# Patient Record
Sex: Male | Born: 1990 | Race: Black or African American | Hispanic: No | Marital: Single | Smoking: Never smoker
Health system: Southern US, Community
[De-identification: ages and names within clinical notes are randomized; demographics above are authoritative.]

## PROBLEM LIST (undated history)

## (undated) ENCOUNTER — Encounter

## (undated) DIAGNOSIS — J36 Peritonsillar abscess: Secondary | ICD-10-CM

## (undated) HISTORY — PX: ABSCESS DRAINAGE: SHX1119

---
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
## 2012-06-12 ENCOUNTER — Observation Stay (INDEPENDENT_AMBULATORY_CARE_PROVIDER_SITE_OTHER)
Admission: EM | Admit: 2012-06-12 | Discharge: 2012-06-12 | Disposition: A | Discharge status: Nursing Home (Includes ICF) | Payer: Medicaid Other | Source: Home / Self Care | Admitting: Otolaryngology

## 2012-06-12 ENCOUNTER — Encounter (HOSPITAL_COMMUNITY): Payer: Self-pay | Admitting: Emergency Medicine

## 2012-06-12 ENCOUNTER — Encounter (HOSPITAL_COMMUNITY)
Admission: EM | Disposition: A | Discharge status: Home / Self Care | Payer: Self-pay | Source: Home / Self Care | Attending: Emergency Medicine

## 2012-06-12 ENCOUNTER — Emergency Department (HOSPITAL_COMMUNITY): Payer: Medicaid Other | Admitting: Anesthesiology

## 2012-06-12 ENCOUNTER — Observation Stay (HOSPITAL_COMMUNITY)
Admission: EM | Admit: 2012-06-12 | Discharge: 2012-06-13 | Disposition: A | Discharge status: Home / Self Care | Payer: Medicaid Other | Attending: Otolaryngology | Admitting: Otolaryngology

## 2012-06-12 ENCOUNTER — Encounter (HOSPITAL_COMMUNITY): Payer: Self-pay | Admitting: Anesthesiology

## 2012-06-12 DIAGNOSIS — J36 Peritonsillar abscess: Principal | ICD-10-CM

## 2012-06-12 HISTORY — PX: TONSILLECTOMY: SHX5217

## 2012-06-12 HISTORY — DX: Peritonsillar abscess: J36

## 2012-06-12 SURGERY — TONSILLECTOMY
Anesthesia: General | Site: Throat | Laterality: Bilateral | Wound class: Dirty or Infected

## 2012-06-12 MED ORDER — OXYCODONE HCL 5 MG PO TABS: 5.0000 mg | ORAL_TABLET | Freq: Once | ORAL | Status: DC | PRN

## 2012-06-12 MED ORDER — OXYCODONE HCL 5 MG/5ML PO SOLN: 5.0000 mg | Freq: Once | ORAL | Status: DC | PRN

## 2012-06-12 MED ORDER — ONDANSETRON HCL 4 MG/2ML IJ SOLN: 4.0000 mg | Freq: Once | INTRAMUSCULAR | Status: DC | PRN

## 2012-06-12 MED ORDER — SODIUM CHLORIDE 0.9 % IV SOLN: 3.0000 g | Freq: Once | INTRAVENOUS | Status: DC

## 2012-06-12 MED ORDER — ARTIFICIAL TEARS OP OINT
TOPICAL_OINTMENT | OPHTHALMIC | Status: DC | PRN
  Administered 2012-06-12: 1 via OPHTHALMIC

## 2012-06-12 MED ORDER — HYDROCODONE-ACETAMINOPHEN 7.5-325 MG/15ML PO SOLN: 10.0000 mL | ORAL | Status: AC | PRN

## 2012-06-12 MED ORDER — PHENOL 1.4 % MT LIQD: 1.0000 | OROMUCOSAL | Status: AC | PRN

## 2012-06-12 MED ORDER — PROMETHAZINE HCL 25 MG RE SUPP: 25.0000 mg | Freq: Four times a day (QID) | RECTAL | Status: AC | PRN

## 2012-06-12 MED ORDER — MEPERIDINE HCL 25 MG/ML IJ SOLN: 6.2500 mg | INTRAMUSCULAR | Status: DC | PRN

## 2012-06-12 MED ORDER — ONDANSETRON HCL 4 MG/2ML IJ SOLN: 4.0000 mg | Freq: Once | INTRAMUSCULAR | Status: DC

## 2012-06-12 MED ORDER — PROMETHAZINE HCL 25 MG RE SUPP: 25.0000 mg | Freq: Four times a day (QID) | RECTAL | Status: DC | PRN

## 2012-06-12 MED ORDER — DEXTROSE-NACL 5-0.9 % IV SOLN
INTRAVENOUS | Status: DC
  Administered 2012-06-12: via INTRAVENOUS

## 2012-06-12 MED ORDER — 0.9 % SODIUM CHLORIDE (POUR BTL) OPTIME
TOPICAL | Status: DC | PRN
  Administered 2012-06-12: 1000 mL

## 2012-06-12 MED ORDER — PHENOL 1.4 % MT LIQD: 1.0000 | OROMUCOSAL | Status: DC | PRN

## 2012-06-12 MED ORDER — HYDROMORPHONE HCL PF 1 MG/ML IJ SOLN: 0.2500 mg | INTRAMUSCULAR | Status: DC | PRN

## 2012-06-12 MED ORDER — METOCLOPRAMIDE HCL 5 MG/ML IJ SOLN
INTRAMUSCULAR | Status: DC | PRN
  Administered 2012-06-12: 10 mg via INTRAVENOUS

## 2012-06-12 MED ORDER — IBUPROFEN 100 MG/5ML PO SUSP
400.0000 mg | Freq: Four times a day (QID) | ORAL | Status: DC | PRN
  Filled 2012-06-12: qty 20

## 2012-06-12 MED ORDER — LACTATED RINGERS IV SOLN
INTRAVENOUS | Status: DC | PRN
  Administered 2012-06-12 (×2): via INTRAVENOUS

## 2012-06-12 MED ORDER — IBUPROFEN 100 MG/5ML PO SUSP: 400.0000 mg | Freq: Four times a day (QID) | ORAL | Status: DC | PRN

## 2012-06-12 MED ORDER — GLYCOPYRROLATE 0.2 MG/ML IJ SOLN
INTRAMUSCULAR | Status: DC | PRN
  Administered 2012-06-12: 0.3 mg via INTRAVENOUS

## 2012-06-12 MED ORDER — LIDOCAINE HCL (CARDIAC) 20 MG/ML IV SOLN
INTRAVENOUS | Status: DC | PRN
  Administered 2012-06-12: 100 mg via INTRAVENOUS

## 2012-06-12 MED ORDER — SODIUM CHLORIDE 0.9 % IV BOLUS (SEPSIS)
1000.0000 mL | Freq: Once | INTRAVENOUS | Status: AC
  Administered 2012-06-13: 1000 mL via INTRAVENOUS

## 2012-06-12 MED ORDER — HYDROCODONE-ACETAMINOPHEN 7.5-325 MG/15ML PO SOLN: 15.0000 mL | ORAL | Status: AC | PRN

## 2012-06-12 MED ORDER — MIDAZOLAM HCL 5 MG/5ML IJ SOLN
INTRAMUSCULAR | Status: DC | PRN
  Administered 2012-06-12: 2 mg via INTRAVENOUS

## 2012-06-12 MED ORDER — PROMETHAZINE HCL 25 MG PO TABS: 25.0000 mg | ORAL_TABLET | Freq: Four times a day (QID) | ORAL | Status: AC | PRN

## 2012-06-12 MED ORDER — CLINDAMYCIN PHOSPHATE 600 MG/50ML IV SOLN
INTRAVENOUS | Status: DC | PRN
  Administered 2012-06-12: 600 mg via INTRAVENOUS

## 2012-06-12 MED ORDER — HYDROCODONE-ACETAMINOPHEN 7.5-325 MG/15ML PO SOLN
10.0000 mL | ORAL | Status: DC | PRN
  Administered 2012-06-12 – 2012-06-13 (×2): 15 mL via ORAL
  Filled 2012-06-12 (×2): qty 15

## 2012-06-12 MED ORDER — CLINDAMYCIN HCL 300 MG PO CAPS: 300.0000 mg | ORAL_CAPSULE | Freq: Three times a day (TID) | ORAL | Status: AC

## 2012-06-12 MED ORDER — MORPHINE SULFATE 4 MG/ML IJ SOLN: 4.0000 mg | Freq: Once | INTRAMUSCULAR | Status: DC

## 2012-06-12 MED ORDER — IBUPROFEN 100 MG/5ML PO SUSP: 400.0000 mg | Freq: Four times a day (QID) | ORAL | Status: AC | PRN

## 2012-06-12 MED ORDER — PROPOFOL 10 MG/ML IV BOLUS
INTRAVENOUS | Status: DC | PRN
  Administered 2012-06-12: 80 mg via INTRAVENOUS
  Administered 2012-06-12: 120 mg via INTRAVENOUS

## 2012-06-12 MED ORDER — FENTANYL CITRATE 0.05 MG/ML IJ SOLN
INTRAMUSCULAR | Status: DC | PRN
  Administered 2012-06-12 (×3): 50 ug via INTRAVENOUS
  Administered 2012-06-12: 150 ug via INTRAVENOUS

## 2012-06-12 MED ORDER — DEXTROSE 5 % IV SOLN
INTRAVENOUS | Status: DC | PRN
  Administered 2012-06-12: 18:00:00 via INTRAVENOUS

## 2012-06-12 MED ORDER — DEXTROSE-NACL 5-0.9 % IV SOLN: INTRAVENOUS | Status: AC

## 2012-06-12 MED ORDER — ONDANSETRON HCL 4 MG/2ML IJ SOLN
INTRAMUSCULAR | Status: DC | PRN
  Administered 2012-06-12: 4 mg via INTRAVENOUS

## 2012-06-12 MED ORDER — CLINDAMYCIN HCL 300 MG PO CAPS
300.0000 mg | ORAL_CAPSULE | Freq: Three times a day (TID) | ORAL | Status: DC
  Administered 2012-06-12 – 2012-06-13 (×3): 300 mg via ORAL
  Filled 2012-06-12 (×5): qty 1

## 2012-06-12 MED ORDER — PROMETHAZINE HCL 25 MG PO TABS: 25.0000 mg | ORAL_TABLET | Freq: Four times a day (QID) | ORAL | Status: DC | PRN

## 2012-06-12 MED ORDER — DEXAMETHASONE SODIUM PHOSPHATE 4 MG/ML IJ SOLN
INTRAMUSCULAR | Status: DC | PRN
  Administered 2012-06-12: 12 mg via INTRAVENOUS

## 2012-06-12 MED ORDER — SUCCINYLCHOLINE CHLORIDE 20 MG/ML IJ SOLN
INTRAMUSCULAR | Status: DC | PRN
  Administered 2012-06-12: 120 mg via INTRAVENOUS

## 2012-06-12 SURGICAL SUPPLY — 29 items
CANISTER SUCTION 2500CC (MISCELLANEOUS) ×2 IMPLANT
CATH ROBINSON RED A/P 10FR (CATHETERS) ×2 IMPLANT
CLEANER TIP ELECTROSURG 2X2 (MISCELLANEOUS) ×2 IMPLANT
CLOTH BEACON ORANGE TIMEOUT ST (SAFETY) ×2 IMPLANT
COAGULATOR SUCT SWTCH 10FR 6 (ELECTROSURGICAL) ×2 IMPLANT
ELECT COATED BLADE 2.86 ST (ELECTRODE) ×2 IMPLANT
ELECT REM PT RETURN 9FT ADLT (ELECTROSURGICAL)
ELECT REM PT RETURN 9FT PED (ELECTROSURGICAL)
ELECTRODE REM PT RETRN 9FT PED (ELECTROSURGICAL) IMPLANT
ELECTRODE REM PT RTRN 9FT ADLT (ELECTROSURGICAL) IMPLANT
GAUZE SPONGE 4X4 16PLY XRAY LF (GAUZE/BANDAGES/DRESSINGS) ×2 IMPLANT
GLOVE ECLIPSE 7.5 STRL STRAW (GLOVE) ×2 IMPLANT
GOWN STRL NON-REIN LRG LVL3 (GOWN DISPOSABLE) ×4 IMPLANT
KIT BASIN OR (CUSTOM PROCEDURE TRAY) ×2 IMPLANT
KIT ROOM TURNOVER OR (KITS) ×2 IMPLANT
NS IRRIG 1000ML POUR BTL (IV SOLUTION) ×2 IMPLANT
PACK SURGICAL SETUP 50X90 (CUSTOM PROCEDURE TRAY) ×2 IMPLANT
PAD ARMBOARD 7.5X6 YLW CONV (MISCELLANEOUS) ×4 IMPLANT
PENCIL FOOT CONTROL (ELECTRODE) ×2 IMPLANT
SPECIMEN JAR SMALL (MISCELLANEOUS) IMPLANT
SPONGE TONSIL 1 RF SGL (DISPOSABLE) ×2 IMPLANT
SYR BULB 3OZ (MISCELLANEOUS) ×2 IMPLANT
TOWEL OR 17X24 6PK STRL BLUE (TOWEL DISPOSABLE) ×4 IMPLANT
TUBE CONNECTING 12X1/4 (SUCTIONS) ×2 IMPLANT
TUBE SALEM SUMP 10F W/ARV (TUBING) IMPLANT
TUBE SALEM SUMP 12R W/ARV (TUBING) ×2 IMPLANT
TUBE SALEM SUMP 14F W/ARV (TUBING) IMPLANT
TUBE SALEM SUMP 16 FR W/ARV (TUBING) IMPLANT
WATER STERILE IRR 1000ML POUR (IV SOLUTION) IMPLANT

## 2012-06-12 NOTE — Anesthesia Procedure Notes (Signed)
Procedure Name: Intubation Date/Time: 06/12/2012 5:58 PM Performed by: Wray Kearns A Pre-anesthesia Checklist: Patient identified, Timeout performed, Emergency Drugs available, Suction available and Patient being monitored Patient Re-evaluated:Patient Re-evaluated prior to inductionOxygen Delivery Method: Circle system utilized Preoxygenation: Pre-oxygenation with 100% oxygen Intubation Type: IV induction, Rapid sequence and Cricoid Pressure applied Laryngoscope Size: Mac and 4 Grade View: Grade I Tube type: Oral Tube size: 8.0 mm Number of attempts: 1 Airway Equipment and Method: Stylet Placement Confirmation: ETT inserted through vocal cords under direct vision,  breath sounds checked- equal and bilateral and positive ETCO2 Secured at: 22 cm Tube secured with: Tape Dental Injury: Teeth and Oropharynx as per pre-operative assessment

## 2012-06-12 NOTE — Op Note (Signed)
06/12/2012  6:33 PM  PATIENT:  Jossie Ng  22 y.o. male  PRE-OPERATIVE DIAGNOSIS:  abscess  POST-OPERATIVE DIAGNOSIS:  Recurrent Peritonsillar Abscess  PROCEDURE:  Procedure(s): TONSILLECTOMY  SURGEON:  Surgeon(s): Serena Colonel, MD  ANESTHESIA:   General  COUNTS: Correct   DICTATION: The patient was taken to the operating room and placed on the operating table in the supine position. Following induction of general endotracheal anesthesia, the table was turned and the patient was draped in a standard fashion. A Crowe-Davis mouthgag was inserted into the oral cavity and used to retract the tongue and mandible, then attached to the Mayo stand.  The tonsillectomy was then performed using electrocautery dissection, carefully dissecting the relatively avascular plane between the capsule and constrictor muscles. There was a residual abscess cavity on the left side. The surrounding tissue was severely inflamed and thickened. Suction cautery was used for completion of hemostasis. The tonsils were discarded.  The pharynx was irrigated with saline and suctioned. An oral gastric tube was used to aspirate the contents of the stomach. The patient was then awakened from anesthesia and transferred to PACU in stable condition.   PATIENT DISPOSITION:  To PACA, stable

## 2012-06-12 NOTE — H&P (Signed)
Scott Williamson is an 22 y.o. male.   Chief Complaint: Sore throat HPI: He had a left peritonsillar abscess drained in [place] about one month ago. He started having bad sore throat on the left side the past several days, similar to what he had when he had the abscess. He is having a hard time swallowing.  Past Medical History  Diagnosis Date  . Tonsillar abscess     Past Surgical History  Procedure Laterality Date  . Abscess drainage      Tonsillar abscess    History reviewed. No pertinent family history. Social History:  reports that he has never smoked. He does not have any smokeless tobacco history on file. He reports that he does not drink alcohol or use illicit drugs.  Allergies: No Known Allergies   (Not in a hospital admission)  No results found for this or any previous visit (from the past 48 hour(s)). No results found.  ROS: otherwise negative  Blood pressure 135/66, pulse 74, temperature 99.7 F (37.6 C), temperature source Oral, resp. rate 18, SpO2 100.00%.  PHYSICAL EXAM: Overall appearance:  Healthy appearing, in no distress Head:  Normocephalic, atraumatic. Ears: External ears are normal. Nose: External nose is healthy in appearance. Internal nasal exam free of any lesions or obstruction. Oral Cavity/pharynx:  There are no mucosal lesions or masses identified. Asymmetric soft tissue swelling of the left side soft palate with erythema and tenderness. Neuro:  No identifiable neurologic deficits. Neck: Tender left level II adenopathy.  Studies Reviewed: none    Assessment/Plan Recurrent peritonsillar abscess. Recommend proceed with tonsillectomy. Risks and benefits of surgery were discussed in detail. All questions were answered.  Scott Williamson 06/12/2012, 5:19 PM

## 2012-06-12 NOTE — Anesthesia Preprocedure Evaluation (Addendum)
Anesthesia Evaluation  Patient identified by MRN, date of birth, ID band Patient awake    Reviewed: Allergy & Precautions, H&P , NPO status , Patient's Chart, lab work & pertinent test results  Airway Mallampati: II TM Distance: >3 FB Neck ROM: Full    Dental  (+) Teeth Intact and Dental Advisory Given   Pulmonary Current Smoker,          Cardiovascular     Neuro/Psych    GI/Hepatic (+)     substance abuse  marijuana use,   Endo/Other    Renal/GU      Musculoskeletal   Abdominal   Peds  Hematology   Anesthesia Other Findings   Reproductive/Obstetrics                           Anesthesia Physical Anesthesia Plan  ASA: II and emergent  Anesthesia Plan: General   Post-op Pain Management:    Induction: Intravenous, Rapid sequence and Cricoid pressure planned  Airway Management Planned: Oral ETT  Additional Equipment:   Intra-op Plan:   Post-operative Plan: Extubation in OR  Informed Consent: I have reviewed the patients History and Physical, chart, labs and discussed the procedure including the risks, benefits and alternatives for the proposed anesthesia with the patient or authorized representative who has indicated his/her understanding and acceptance.     Plan Discussed with: CRNA and Surgeon  Anesthesia Plan Comments:         Anesthesia Quick Evaluation

## 2012-06-12 NOTE — ED Notes (Signed)
Pt states that he has had a tonsillar abcess x 1 month. Pt was seen at Urgent Care this morning.

## 2012-06-12 NOTE — Preoperative (Signed)
Beta Blockers   Reason not to administer Beta Blockers:Not Applicable 

## 2012-06-12 NOTE — ED Notes (Signed)
Chart review.

## 2012-06-12 NOTE — ED Provider Notes (Signed)
Medical screening examination/treatment/procedure(s) were performed by non-physician practitioner and as supervising physician I was immediately available for consultation/collaboration.  Autumne Kallio, M.D.  Xxavier Noon C Ahniya Mitchum, MD 06/12/12 1923 

## 2012-06-12 NOTE — ED Provider Notes (Signed)
History     CSN: [id number]  Arrival date & time 06/12/12  1628   First MD Initiated Contact with Patient 06/12/12 1631      Chief Complaint  Patient presents with  . Abscess    Tonsilar abscess    (Consider location/radiation/quality/duration/timing/severity/associated sxs/prior treatment) The history is provided by the patient.  pt c/o sore throat for the past couple days. Pain, constant, dull, moderate, non radiating, localized to left side of throat, worse w swallowing. Is able to swallow. No trouble breathing. Denies cough, runny nose or other uri c/o. subj fever. States has left peritonsillar abscess treated in [place] approximately 1-2 months ago, resolve w rx.     Past Medical History  Diagnosis Date  . Tonsillar abscess     No past surgical history on file.  No family history on file.  History  Substance Use Topics  . Smoking status: Never Smoker   . Smokeless tobacco: Not on file  . Alcohol Use: No      Review of Systems  Constitutional: Positive for fever. Negative for chills.  HENT: Positive for sore throat. Negative for neck pain and neck stiffness.   Respiratory: Negative for cough and shortness of breath.   Gastrointestinal: Negative for vomiting.  Neurological: Negative for headaches.    Allergies  Review of patient's allergies indicates no known allergies.  Home Medications  No current outpatient prescriptions on file.  BP 122/59  Pulse 60  Temp(Src) 99.2 F (37.3 C) (Oral)  Ht 6\' 1"  (1.854 m)  Wt 185 lb (83.915 kg)  BMI 24.41 kg/m2  SpO2 98%  Physical Exam  Nursing note and vitals reviewed. Constitutional: He appears well-developed and well-nourished. No distress.  HENT:  Nose: Nose normal.  Pharynx erythematous. Asymmetric swelling L>>R, c/w peritonsillar abscess. Uvula midline.   Eyes: Conjunctivae are normal.  Neck: Neck supple. No tracheal deviation present.  Cardiovascular: Normal rate, regular rhythm, normal heart sounds  and intact distal pulses.   Pulmonary/Chest: Effort normal and breath sounds normal. No accessory muscle usage. No respiratory distress.  No stridor or difficulty breathing.   Abdominal: He exhibits no distension.  Musculoskeletal: He exhibits no edema.  Lymphadenopathy:    He has cervical adenopathy.  Neurological: He is alert.  Skin: Skin is warm and dry. No rash noted. He is not diaphoretic.  Psychiatric: He has a normal mood and affect.    ED Course  Procedures (including critical care time)     MDM  Iv ns bolus. Morphine iv. zofran iv.  Iv abx.  Ent called to inform pt in ed.   Reviewed nursing notes and prior charts for additional history.           Suzi Roots, MD 06/12/12 [phone]

## 2012-06-12 NOTE — Anesthesia Postprocedure Evaluation (Signed)
Anesthesia Post Note  Patient: Scott Williamson  Procedure(s) Performed: Procedure(s) (LRB): TONSILLECTOMY (Bilateral)  Anesthesia type: general  Patient location: PACU  Post pain: Pain level controlled  Post assessment: Patient's Cardiovascular Status Stable  Last Vitals:  Filed Vitals:   06/12/12 1637  BP: 122/59  Pulse: 60  Temp: 37.3 C    Post vital signs: Reviewed and stable  Level of consciousness: sedated  Complications: No apparent anesthesia complications

## 2012-06-12 NOTE — Transfer of Care (Signed)
Immediate Anesthesia Transfer of Care Note  Patient: Scott Williamson  Procedure(s) Performed: Procedure(s): TONSILLECTOMY (Bilateral)  Patient Location: PACU  Anesthesia Type:General  Level of Consciousness: oriented, sedated, patient cooperative and responds to stimulation  Airway & Oxygen Therapy: Patient Spontanous Breathing and Patient connected to face mask oxygen  Post-op Assessment: Report given to PACU RN, Post -op Vital signs reviewed and stable, Patient moving all extremities and Patient moving all extremities X 4  Post vital signs: Reviewed and stable  Complications: No apparent anesthesia complications

## 2012-06-12 NOTE — ED Notes (Signed)
Hx peritonsilar abscess drainage in the middle of [place].   Swelling of left side of throat with severe pain.   Pt unable to eat/drink. Pt has not had any medications for symptoms.

## 2012-06-12 NOTE — ED Provider Notes (Signed)
History     CSN: [id number]  Arrival date & time 06/12/12  1519   None     Chief Complaint  Patient presents with  . Sore Throat    (Consider location/radiation/quality/duration/timing/severity/associated sxs/prior treatment) HPI Comments: Pt presents c/o left-sided sore throat, severe pain with attempting to swallow, and a change in his voice.  He feels the pain going up into his ear He had a peritonsillar abscess that was treated 1 month ago in [place] with I&D and he feels like it may have come back.     Past Medical History  Diagnosis Date  . Tonsillar abscess     History reviewed. No pertinent past surgical history.  History reviewed. No pertinent family history.  History  Substance Use Topics  . Smoking status: Never Smoker   . Smokeless tobacco: Not on file  . Alcohol Use: No      Review of Systems  Constitutional: Positive for fever. Negative for chills and fatigue.  HENT: Positive for sore throat, trouble swallowing and voice change. Negative for neck pain and neck stiffness.   Eyes: Negative for visual disturbance.  Respiratory: Negative for cough and shortness of breath.   Cardiovascular: Negative for chest pain, palpitations and leg swelling.  Gastrointestinal: Negative for nausea, vomiting, abdominal pain, diarrhea and constipation.  Genitourinary: Negative for dysuria, urgency, frequency and hematuria.  Musculoskeletal: Negative for myalgias and arthralgias.  Skin: Negative for rash.  Neurological: Negative for dizziness, weakness and light-headedness.    Allergies  Review of patient's allergies indicates no known allergies.  Home Medications  No current outpatient prescriptions on file.  BP 135/66  Pulse 74  Temp(Src) 99.7 F (37.6 C) (Oral)  Resp 18  SpO2 100%  Physical Exam  Constitutional: He is oriented to person, place, and time. He appears well-developed and well-nourished. He appears distressed (mild ).  HENT:  Head:  Normocephalic and atraumatic.  Mouth/Throat: Tonsillar abscesses (left) present.  Left posterior oropharynx erythema and swelling, with uvula shifted to the right of midline.  Hot potato voice.  Pain exacerbated by opening mouth   Lymphadenopathy:    He has cervical adenopathy (tender swollen left LAD).  Neurological: He is alert and oriented to person, place, and time.  Skin: Skin is warm and dry. No rash noted.  Psychiatric: He has a normal mood and affect. Judgment normal.    ED Course  Procedures (including critical care time)  Labs Reviewed - No data to display No results found.   1. Peritonsillar abscess       MDM  Spoke with Dr. Pollyann Kennedy, on-call HENT physician, and then discussed treatment options with the pt.  Pt elects to go with surgical drainage and tonsillectomy to prevent recurrence.  Pt is being transferred to the hospital for procedure.          Graylon Good, PA-C 06/12/12 1622

## 2012-06-13 MED ORDER — BIOTENE DRY MOUTH MT LIQD
15.0000 mL | Freq: Two times a day (BID) | OROMUCOSAL | Status: DC
  Administered 2012-06-13: 15 mL via OROMUCOSAL

## 2012-06-13 MED ORDER — CHLORHEXIDINE GLUCONATE 0.12 % MT SOLN
15.0000 mL | Freq: Two times a day (BID) | OROMUCOSAL | Status: DC
  Administered 2012-06-13: 15 mL via OROMUCOSAL
  Filled 2012-06-13: qty 15

## 2012-06-13 NOTE — Discharge Summary (Signed)
Physician Discharge Summary  Patient ID: Scott Williamson MRN: [id number] DOB/AGE: [birth year] 22 y.o.  Admit date: 06/12/2012 Discharge date: 06/13/2012  Admission Diagnoses: Recurrent Peritonsillar abscess  Discharge Diagnoses:  Active Problems:   * No active hospital problems. *   Discharged Condition: good  Hospital Course: no complications  Consults: none  Significant Diagnostic Studies: none  Treatments: surgery: Tonsillectomy  Discharge Exam: Blood pressure 106/62, pulse 56, temperature 98 F (36.7 C), temperature source Oral, resp. rate 20, height 6\' 1"  (1.854 m), weight 152 lb 8.9 oz (69.2 kg), SpO2 99.00%. PHYSICAL EXAM: No trismus, no bleeding, taking po well.  Disposition: 04-Intermediate Care Facility     Medication List    TAKE these medications       clindamycin 300 MG capsule  Commonly known as:  CLEOCIN  Take 1 capsule (300 mg total) by mouth 3 (three) times daily.     HYDROcodone-acetaminophen 7.5-325 mg/15 ml solution  Commonly known as:  HYCET  Take 15 mLs by mouth every 4 (four) hours as needed for pain.     promethazine 25 MG suppository  Commonly known as:  PHENERGAN  Place 1 suppository (25 mg total) rectally every 6 (six) hours as needed for nausea.     sulfamethoxazole-trimethoprim 800-160 MG per tablet  Commonly known as:  BACTRIM DS  Take 1 tablet by mouth 2 (two) times daily.           Follow-up Information   Follow up with Serena Colonel, MD. Schedule an appointment as soon as possible for a visit in 2 weeks.   Contact information:   [address] [place], [place] [address] [place] [address] [phone]       Signed: Serena Colonel 06/13/2012, 11:03 AM

## 2012-06-15 ENCOUNTER — Encounter (HOSPITAL_COMMUNITY): Payer: Self-pay | Admitting: Otolaryngology

## 2013-12-10 ENCOUNTER — Emergency Department (HOSPITAL_COMMUNITY): Payer: No Typology Code available for payment source

## 2013-12-10 ENCOUNTER — Encounter (HOSPITAL_COMMUNITY): Payer: Self-pay

## 2013-12-10 ENCOUNTER — Emergency Department (HOSPITAL_COMMUNITY)
Admission: EM | Admit: 2013-12-10 | Discharge: 2013-12-10 | Disposition: A | Discharge status: Home / Self Care | Payer: No Typology Code available for payment source | Attending: Emergency Medicine | Admitting: Emergency Medicine

## 2013-12-10 DIAGNOSIS — S80811A Abrasion, right lower leg, initial encounter: Secondary | ICD-10-CM | POA: Insufficient documentation

## 2013-12-10 DIAGNOSIS — Z8709 Personal history of other diseases of the respiratory system: Secondary | ICD-10-CM | POA: Diagnosis not present

## 2013-12-10 DIAGNOSIS — M25579 Pain in unspecified ankle and joints of unspecified foot: Secondary | ICD-10-CM

## 2013-12-10 DIAGNOSIS — S299XXA Unspecified injury of thorax, initial encounter: Secondary | ICD-10-CM | POA: Insufficient documentation

## 2013-12-10 DIAGNOSIS — S90511A Abrasion, right ankle, initial encounter: Secondary | ICD-10-CM | POA: Diagnosis not present

## 2013-12-10 DIAGNOSIS — Y998 Other external cause status: Secondary | ICD-10-CM | POA: Insufficient documentation

## 2013-12-10 DIAGNOSIS — Z792 Long term (current) use of antibiotics: Secondary | ICD-10-CM | POA: Diagnosis not present

## 2013-12-10 DIAGNOSIS — R519 Headache, unspecified: Secondary | ICD-10-CM

## 2013-12-10 DIAGNOSIS — Z23 Encounter for immunization: Secondary | ICD-10-CM | POA: Diagnosis not present

## 2013-12-10 DIAGNOSIS — M545 Low back pain, unspecified: Secondary | ICD-10-CM

## 2013-12-10 DIAGNOSIS — S3992XA Unspecified injury of lower back, initial encounter: Secondary | ICD-10-CM | POA: Insufficient documentation

## 2013-12-10 DIAGNOSIS — Y9389 Activity, other specified: Secondary | ICD-10-CM | POA: Diagnosis not present

## 2013-12-10 DIAGNOSIS — S61212A Laceration without foreign body of right middle finger without damage to nail, initial encounter: Secondary | ICD-10-CM | POA: Diagnosis not present

## 2013-12-10 DIAGNOSIS — Y92410 Unspecified street and highway as the place of occurrence of the external cause: Secondary | ICD-10-CM | POA: Insufficient documentation

## 2013-12-10 DIAGNOSIS — R51 Headache: Secondary | ICD-10-CM

## 2013-12-10 DIAGNOSIS — S0990XA Unspecified injury of head, initial encounter: Secondary | ICD-10-CM | POA: Diagnosis not present

## 2013-12-10 MED ORDER — NAPROXEN 500 MG PO TABS: 500.0000 mg | ORAL_TABLET | Freq: Two times a day (BID) | ORAL | Status: AC

## 2013-12-10 MED ORDER — TETANUS-DIPHTH-ACELL PERTUSSIS 5-2.5-18.5 LF-MCG/0.5 IM SUSP
0.5000 mL | Freq: Once | INTRAMUSCULAR | Status: AC
  Administered 2013-12-10: 0.5 mL via INTRAMUSCULAR

## 2013-12-10 MED ORDER — METHOCARBAMOL 500 MG PO TABS: 500.0000 mg | ORAL_TABLET | Freq: Two times a day (BID) | ORAL | Status: AC

## 2013-12-10 NOTE — ED Notes (Signed)
MD at bedside. 
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
## 2013-12-10 NOTE — ED Notes (Signed)
Patient transported to CT 
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
## 2016-04-16 ENCOUNTER — Inpatient Hospital Stay: Admit: 2016-04-16 | Discharge: 2016-04-16

## 2016-04-16 DIAGNOSIS — Z79899 Other long term (current) drug therapy: Secondary | ICD-10-CM

## 2016-04-16 DIAGNOSIS — B009 Herpesviral infection, unspecified: Principal | ICD-10-CM

## 2016-04-16 DIAGNOSIS — R369 Urethral discharge, unspecified: Principal | ICD-10-CM

## 2016-04-16 DIAGNOSIS — F17210 Nicotine dependence, cigarettes, uncomplicated: Secondary | ICD-10-CM

## 2016-04-16 DIAGNOSIS — A64 Unspecified sexually transmitted disease: Secondary | ICD-10-CM

## 2016-04-16 MED ORDER — ONDANSETRON 4 MG PO TBDP
4 mg | Freq: Once | ORAL | Status: CP
Start: 2016-04-16 — End: ?

## 2016-04-16 MED ORDER — AZITHROMYCIN 250 MG PO TABS
1000 mg | Freq: Once | ORAL | Status: CP
Start: 2016-04-16 — End: ?

## 2016-04-16 MED ORDER — CEFTRIAXONE SODIUM 250 MG IJ SOLR
250 mg | Freq: Once | INTRAMUSCULAR | Status: CP
Start: 2016-04-16 — End: ?

## 2016-04-16 MED ORDER — METRONIDAZOLE 500 MG PO TABS
2000 mg | Freq: Once | ORAL | Status: CP
Start: 2016-04-16 — End: ?

## 2016-04-16 MED ORDER — VALACYCLOVIR HCL 500 MG PO TABS
Freq: Two times a day (BID) | ORAL
Start: 2016-04-16 — End: ?

## 2016-04-16 NOTE — ED Provider Notes
BMI (Calculated) 04/16/16 1237 23.1             Physical Exam    Differential DDx: ***    Is this an Emergent Medical Condition? {SH ED EMERGENT MEDICAL [phone]}  409.901 FS  641.19 FS  627.732 (16) FS    ED Workup   Procedures    Labs:  - - No data to display    Urine GC/Chlamydia pending    Imaging (Read by ED Provider):  {Imaging findings:[phone]}      EKG (Read by ED Provider):  {EKG [phone]}        ED Course & Re-Evaluation     Cherisse R Mabe, PA-C 5:11 PM 04/16/2016      MDM   Decide to obtain history from someone other than the patient: {SH ED Lamonte Sakai MDM - OBTAIN ZOXWRUE:45409}    Decide to obtain previous medical records: [facility] ED Lamonte Sakai MDM - PREVIOUS MED REC - NO WJX:91478}    Clinical Lab Test(s): {SH ED Lamonte Sakai MDM ORDERED AND REVIEWED:28124}    Diagnostic Tests (Radiology, EKG): {SH ED Lamonte Sakai MDM ORDERED AND REVIEWED:28124}    Independent Visualization (ED [place], Wet Prep, Other): {SH ED Lamonte Sakai MDM NO YES GNFAOZHY:86578}    Discussed patient with NON-ED Provider: {SH ED Lamonte Sakai MDM - ANOTHER PROVIDER:28381}      ED Disposition   ED Disposition: No ED Disposition Set      ED Clinical Impression   ED Clinical Impression:   No Clinical Impression Set      ED Patient Status   Patient Status:   {SH ED JX PATIENT [phone]}        ED Medical Evaluation Initiated   Medical Evaluation Initiated:   Yes, filed at 04/16/16 1625  by Tery Sanfilippo, PA-C

## 2016-04-16 NOTE — ED Provider Notes
rash or lesions.  No testicular tenderness or scrotal edema.    Musculoskeletal: Normal range of motion.   Neurological: He is alert. GCS eye subscore is 4. GCS verbal subscore is 5. GCS motor subscore is 6.   Skin: Skin is warm and dry. He is not diaphoretic.   Psychiatric: He has a normal mood and affect. His speech is normal and behavior is normal.   Nursing note and vitals reviewed.      Differential DDx: STD, urethritis, other    Is this an Emergent Medical Condition? Yes - Severe Pain/Acute Onset of Symptons  409.901 FS  641.19 FS  627.732 (16) FS    ED Workup   Procedures    Labs:  - - No data to display    Urine GC/Chlamydia pending    Imaging (Read by ED Provider):  not applicable      EKG (Read by ED Provider):  not applicable        ED Course & Re-Evaluation     Cherisse R Mabe, PA-C 5:11 PM 04/16/2016  Urine GC/Chlamydia pending.  Rocephin  IM + Zithromax 1gm PO + Flagyl 2gm PO ordered. Patient counseled no sex for at least 10 days and all symptoms resolved.  Notify all partners of treatment.  Always [place] condoms.  Recommend HIV/Syphilis testing at local health department. Patient verbalizes understanding.  Will discharge home.      MDM   Decide to obtain history from someone other than the patient: No    Decide to obtain previous medical records: No    Clinical Lab Test(s): Ordered (GC/Chlamydia pending)    Diagnostic Tests (Radiology, EKG): N/A    Independent Visualization (ED [place], Wet Prep, Other): No    Discussed patient with NON-ED Provider: None      ED Disposition   ED Disposition: Discharge      ED Clinical Impression   ED Clinical Impression:   STD (male)  Abnormal penile discharge      ED Patient Status   Patient Status:   Good        ED Medical Evaluation Initiated   Medical Evaluation Initiated:   Yes, filed at 04/16/16 1625  by Tery Sanfilippo, PA-C             Mabe, Cherisse R, PA-C  04/16/16 1728

## 2016-04-16 NOTE — ED Attestation Note
Attestation   I discussed this patient with the ARNP/PA.              Bennie Pierini, MD  04/16/16 1739

## 2016-04-16 NOTE — ED Provider Notes
?   Drug use: None   ? Sexual activity: Yes     Partners: Female     Birth control/ protection: None     Other Topics Concern   ? None     Social History Narrative   ? None       Review of Systems   Constitutional: Negative for fever and chills.   Respiratory: Negative for shortness of breath.    Cardiovascular: Negative for chest pain.   Gastrointestinal: Negative for nausea, vomiting, abdominal pain and diarrhea.   Genitourinary: Positive for penile discharge. Negative for dysuria, hematuria, flank pain, penile swelling, scrotal swelling, difficulty urinating, genital sores, penile pain and testicular pain.   Skin: Negative for rash.   All other systems reviewed and are negative.      Physical Exam       ED Triage Vitals   BP 04/16/16 1237 117/71   Pulse 04/16/16 1237 94   Resp 04/16/16 1237 14   Temp 04/16/16 1237 36.9 ?C (98.4 ?F)   Temp src 04/16/16 1237 Oral   Height 04/16/16 1237 1.829 m   Weight 04/16/16 1237 77.1 kg   SpO2 04/16/16 1237 99 %   BMI (Calculated) 04/16/16 1237 23.1             Physical Exam   Constitutional: He appears well-developed and well-nourished.  Non-toxic appearance. He does not have a sickly appearance. He does not appear ill. No distress.   Patient sitting up in chair talking on cell phone. No distress noted.    HENT:   Head: Normocephalic and atraumatic.   Neck: Phonation normal.   Cardiovascular: Normal rate and regular rhythm.    Pulmonary/Chest: Effort normal and breath sounds normal. No stridor. No respiratory distress. He has no decreased breath sounds. He has no wheezes. He has no rhonchi. He has no rales.   Abdominal: Soft. There is no tenderness. There is no rigidity, no rebound and no guarding.   Genitourinary: Right testis shows no swelling and no tenderness. Left testis shows no swelling and no tenderness. Circumcised. Discharge found.   Genitourinary Comments: Chaperone: Todd RN    GU: normal circumcised male with light yellow penile discharge noted. No

## 2016-04-16 NOTE — ED Notes
Time of discharge: 1739  PM., Patient discharged to  Home.  Patient discharged  ambulatory. to exit with belongings in  Stable condition.  Patient escorted by  no one., Written discharge instructions given to  patient.  Patient/recipient  verbalizes discharge instructions.

## 2016-04-16 NOTE — ED Triage Notes
Pt ambulates to triage with c/o penile discharge. Pt states he had this before and it was Chlamydia. Pt denies dysuria.

## 2016-04-16 NOTE — ED Provider Notes
BMI (Calculated) 04/16/16 1237 23.1             Physical Exam    Differential DDx: ***    Is this an Emergent Medical Condition? {SH ED EMERGENT MEDICAL CONDITION:[phone]}  409.901 FS  641.19 FS  627.732 (16) FS    ED Workup   Procedures    Labs:  - - No data to display      Imaging (Read by ED Provider):  {Imaging findings:[phone]}      EKG (Read by ED Provider):  {EKG findings:[phone]}        ED Course & Re-Evaluation     ED Course         MDM   Decide to obtain history from someone other than the patient: {SH ED Lamonte Sakai MDM - OBTAIN ZOXWRUE:45409}    Decide to obtain previous medical records: [facility] ED Lamonte Sakai MDM - PREVIOUS MED REC - NO WJX:91478}    Clinical Lab Test(s): {SH ED Lamonte Sakai MDM ORDERED AND REVIEWED:28124}    Diagnostic Tests (Radiology, EKG): {SH ED Lamonte Sakai MDM ORDERED AND REVIEWED:28124}    Independent Visualization (ED [place], Wet Prep, Other): {SH ED Lamonte Sakai MDM NO YES GNFAOZHY:86578}    Discussed patient with NON-ED Provider: {SH ED Lamonte Sakai MDM - ANOTHER PROVIDER:28381}      ED Disposition   ED Disposition: No ED Disposition Set      ED Clinical Impression   ED Clinical Impression:   No Clinical Impression Set      ED Patient Status   Patient Status:   {SH ED JX PATIENT STATUS:[phone]}        ED Medical Evaluation Initiated   Medical Evaluation Initiated:   Yes, filed at 04/16/16 1625  by Tery Sanfilippo, PA-C

## 2016-04-16 NOTE — ED Provider Notes
History     Chief Complaint   Patient presents with   ? STD Male       HPI Comments: "milk colored" penile discharge x 3 days  Uncircumcised  Sexually active x 1 partner - states consistent condom use  History of genital herpes - last outbreak 2 weeks ago (no current lesions)  No prior HIV test  No dysuria or fever  History of Chlamydia treated 7 months ago  New sexual partner    Patient is a 26 y.o. male presenting with STD Male. The history is provided by the patient.   STD Male   Presenting symptoms: penile discharge    Presenting symptoms: no dysuria    Risk factors: new sexual partner and recent sexual activity        No Known Allergies    Patient's Medications   New Prescriptions    No medications on file   Previous Medications    VALACYCLOVIR (VALTREX) 500 MG PO TABLET    Take by mouth 2 times daily.   Modified Medications    No medications on file   Discontinued Medications    No medications on file       Past Medical History:   Diagnosis Date   ? Herpes        History reviewed. No pertinent surgical history.    No family history on file.    Social History     Social History   ? Marital status: Single     Spouse name: N/A   ? Number of children: N/A   ? Years of education: N/A     Social History Main Topics   ? Smoking status: Current Every Day Smoker     Packs/day: 0.25     Types: Cigarettes   ? Smokeless tobacco: Never Used   ? Alcohol use No   ? Drug use: None   ? Sexual activity: Yes     Partners: Female     Birth control/ protection: None     Other Topics Concern   ? None     Social History Narrative   ? None       Review of Systems   Genitourinary: Positive for penile discharge. Negative for dysuria.       Physical Exam     ED Triage Vitals   BP 04/16/16 1237 117/71   Pulse 04/16/16 1237 94   Resp 04/16/16 1237 14   Temp 04/16/16 1237 36.9 ?C (98.4 ?F)   Temp src 04/16/16 1237 Oral   Height 04/16/16 1237 1.829 m   Weight 04/16/16 1237 77.1 kg   SpO2 04/16/16 1237 99 %

## 2016-04-16 NOTE — ED Provider Notes
History     Chief Complaint   Patient presents with   ? STD Male       HPI Comments: "milk colored" penile discharge x 3 days  Circumcised  Sexually active x 1 partner - states consistent condom use  History of genital herpes - last outbreak 2 weeks ago resolved with Valtrex (no current lesions)  No prior HIV test  No dysuria or fever  History of Chlamydia treated 7 months ago  New sexual partner    Patient is a 26 y.o. male presenting with STD Male. The history is provided by the patient.   STD Male   Presenting symptoms: penile discharge    Presenting symptoms: no dysuria    Risk factors: new sexual partner and recent sexual activity        No Known Allergies    Patient's Medications   New Prescriptions    No medications on file   Previous Medications    VALACYCLOVIR (VALTREX) 500 MG PO TABLET    Take by mouth 2 times daily.   Modified Medications    No medications on file   Discontinued Medications    No medications on file       Past Medical History:   Diagnosis Date   ? Herpes        History reviewed. No pertinent surgical history.    No family history on file.    Social History     Social History   ? Marital status: Single     Spouse name: N/A   ? Number of children: N/A   ? Years of education: N/A     Social History Main Topics   ? Smoking status: Current Every Day Smoker     Packs/day: 0.25     Types: Cigarettes   ? Smokeless tobacco: Never Used   ? Alcohol use No   ? Drug use: None   ? Sexual activity: Yes     Partners: Female     Birth control/ protection: None     Other Topics Concern   ? None     Social History Narrative   ? None       Review of Systems   Genitourinary: Positive for penile discharge. Negative for dysuria.       Physical Exam     ED Triage Vitals   BP 04/16/16 1237 117/71   Pulse 04/16/16 1237 94   Resp 04/16/16 1237 14   Temp 04/16/16 1237 36.9 ?C (98.4 ?F)   Temp src 04/16/16 1237 Oral   Height 04/16/16 1237 1.829 m   Weight 04/16/16 1237 77.1 kg   SpO2 04/16/16 1237 99 %

## 2016-04-16 NOTE — ED Provider Notes
History     Chief Complaint   Patient presents with   ? STD Male       HPI Comments: 26 y/o BM with history of genital herpes (last outbreak 2 weeks ago - resolved with Valtrex - no current lesions) presents to ED complaining of "milk colored" penile discharge x 3 days.  +new sexual partner x 1 - states consistent condom use.  History of Chlamydia treated 7 months ago.  Denies dysuria, rash/lesions, abdominal pain, fever, N/V, testicular pain/scrotal edema.  No prior HIV test.  +circumcised.  Patient denies further complaints.             Patient is a 26 y.o. male presenting with STD Male. The history is provided by the patient.   STD Male   Presenting symptoms: penile discharge    Presenting symptoms: no dysuria, no penile pain, no scrotal pain and no swelling    Context: after intercourse    Relieved by:  None tried  Worsened by:  Nothing  Ineffective treatments:  None tried  Associated symptoms: no abdominal pain, no diarrhea, no fever, no flank pain, no genital itching, no genital lesions, no genital rash, no groin pain, no hematuria, no nausea, no penile swelling, no scrotal swelling and no vomiting    Risk factors: new sexual partner and recent sexual activity        No Known Allergies    Patient's Medications   New Prescriptions    No medications on file   Previous Medications    VALACYCLOVIR (VALTREX) 500 MG PO TABLET    Take by mouth 2 times daily.   Modified Medications    No medications on file   Discontinued Medications    No medications on file       Past Medical History:   Diagnosis Date   ? Herpes        History reviewed. No pertinent surgical history.    No family history on file.    Social History     Social History   ? Marital status: Single     Spouse name: N/A   ? Number of children: N/A   ? Years of education: N/A     Social History Main Topics   ? Smoking status: Current Every Day Smoker     Packs/day: 0.25     Types: Cigarettes   ? Smokeless tobacco: Never Used   ? Alcohol use No

## 2016-04-16 NOTE — ED Provider Notes
rash or lesions.  No testicular tenderness or scrotal edema.    Musculoskeletal: Normal range of motion.   Neurological: He is alert. GCS eye subscore is 4. GCS verbal subscore is 5. GCS motor subscore is 6.   Skin: Skin is warm and dry. He is not diaphoretic.   Psychiatric: He has a normal mood and affect. His speech is normal and behavior is normal.   Nursing note and vitals reviewed.      Differential DDx: STD, urethritis, other    Is this an Emergent Medical Condition? Yes - Severe Pain/Acute Onset of Symptons  409.901 FS  641.19 FS  627.732 (16) FS    ED Workup   Procedures    Labs:  - - No data to display    Urine GC/Chlamydia pending    Imaging (Read by ED Provider):  not applicable      EKG (Read by ED Provider):  not applicable        ED Course & Re-Evaluation     Cherisse R Mabe, PA-C 5:11 PM 04/16/2016  Urine GC/Chlamydia pending.  Rocephin  IM + Zithromax 1gm PO + Flagyl 2gm PO ordered. Patient counseled no sex for at least 10 days and all symptoms resolved.  Notify all partners of treatment.  Always [place] condoms.  Recommend HIV/Syphilis testing at local health department. Patient verbalizes understanding.  Will discharge home.      MDM   Decide to obtain history from someone other than the patient: No    Decide to obtain previous medical records: No    Clinical Lab Test(s): Ordered (GC/Chlamydia pending)    Diagnostic Tests (Radiology, EKG): N/A    Independent Visualization (ED [place], Wet Prep, Other): No    Discussed patient with NON-ED Provider: None      ED Disposition   ED Disposition: No ED Disposition Set      ED Clinical Impression   ED Clinical Impression:   No Clinical Impression Set      ED Patient Status   Patient Status:   Good        ED Medical Evaluation Initiated   Medical Evaluation Initiated:   Yes, filed at 04/16/16 1625  by Tery Sanfilippo, PA-C
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
